# Patient Record
Sex: Male | Born: 1960 | Race: White | Hispanic: No | Marital: Married | Smoking: Never smoker
Health system: Southern US, Community
[De-identification: ages and names within clinical notes are randomized; demographics above are authoritative.]

## PROBLEM LIST (undated history)

## (undated) HISTORY — PX: TONSILLECTOMY AND ADENOIDECTOMY: SUR1326

## (undated) HISTORY — PX: CYST EXCISION: SHX5701

---

## 1997-06-10 ENCOUNTER — Emergency Department (HOSPITAL_COMMUNITY): Admission: EM | Admit: 1997-06-10 | Discharge: 1997-06-10 | Payer: Self-pay | Admitting: Emergency Medicine

## 2005-11-17 ENCOUNTER — Emergency Department: Payer: Self-pay | Admitting: Emergency Medicine

## 2005-11-20 ENCOUNTER — Emergency Department: Payer: Self-pay | Admitting: Emergency Medicine

## 2005-11-24 ENCOUNTER — Emergency Department: Payer: Self-pay | Admitting: Emergency Medicine

## 2005-11-30 ENCOUNTER — Emergency Department: Payer: Self-pay | Admitting: Emergency Medicine

## 2005-12-14 ENCOUNTER — Emergency Department: Payer: Self-pay | Admitting: Emergency Medicine

## 2006-01-03 ENCOUNTER — Ambulatory Visit: Payer: Self-pay | Admitting: Internal Medicine

## 2009-12-22 ENCOUNTER — Ambulatory Visit: Payer: Self-pay | Admitting: Gastroenterology

## 2009-12-25 LAB — PATHOLOGY REPORT

## 2010-10-03 ENCOUNTER — Ambulatory Visit: Payer: Self-pay | Admitting: Family Medicine

## 2013-03-19 IMAGING — CR DG CHEST 2V
1 series · 2 of 2 positions shown · non-contrast
Comparison: none

REASON FOR EXAM: bronchitis
COMMENTS:

PROCEDURE:     KDR - KDXR CHEST PA (OR AP) AND LAT  - October 03, 2010 [DATE]
RESULT:     The lung fields are clear. No pneumonia, pneumothorax or pleural
effusion is seen. The heart size is normal. The mediastinal and osseous
structures show no significant abnormalities.

[Series 1: view not recorded · 0.17mm/px · 2 of 2 slices shown]
[im 1/2]
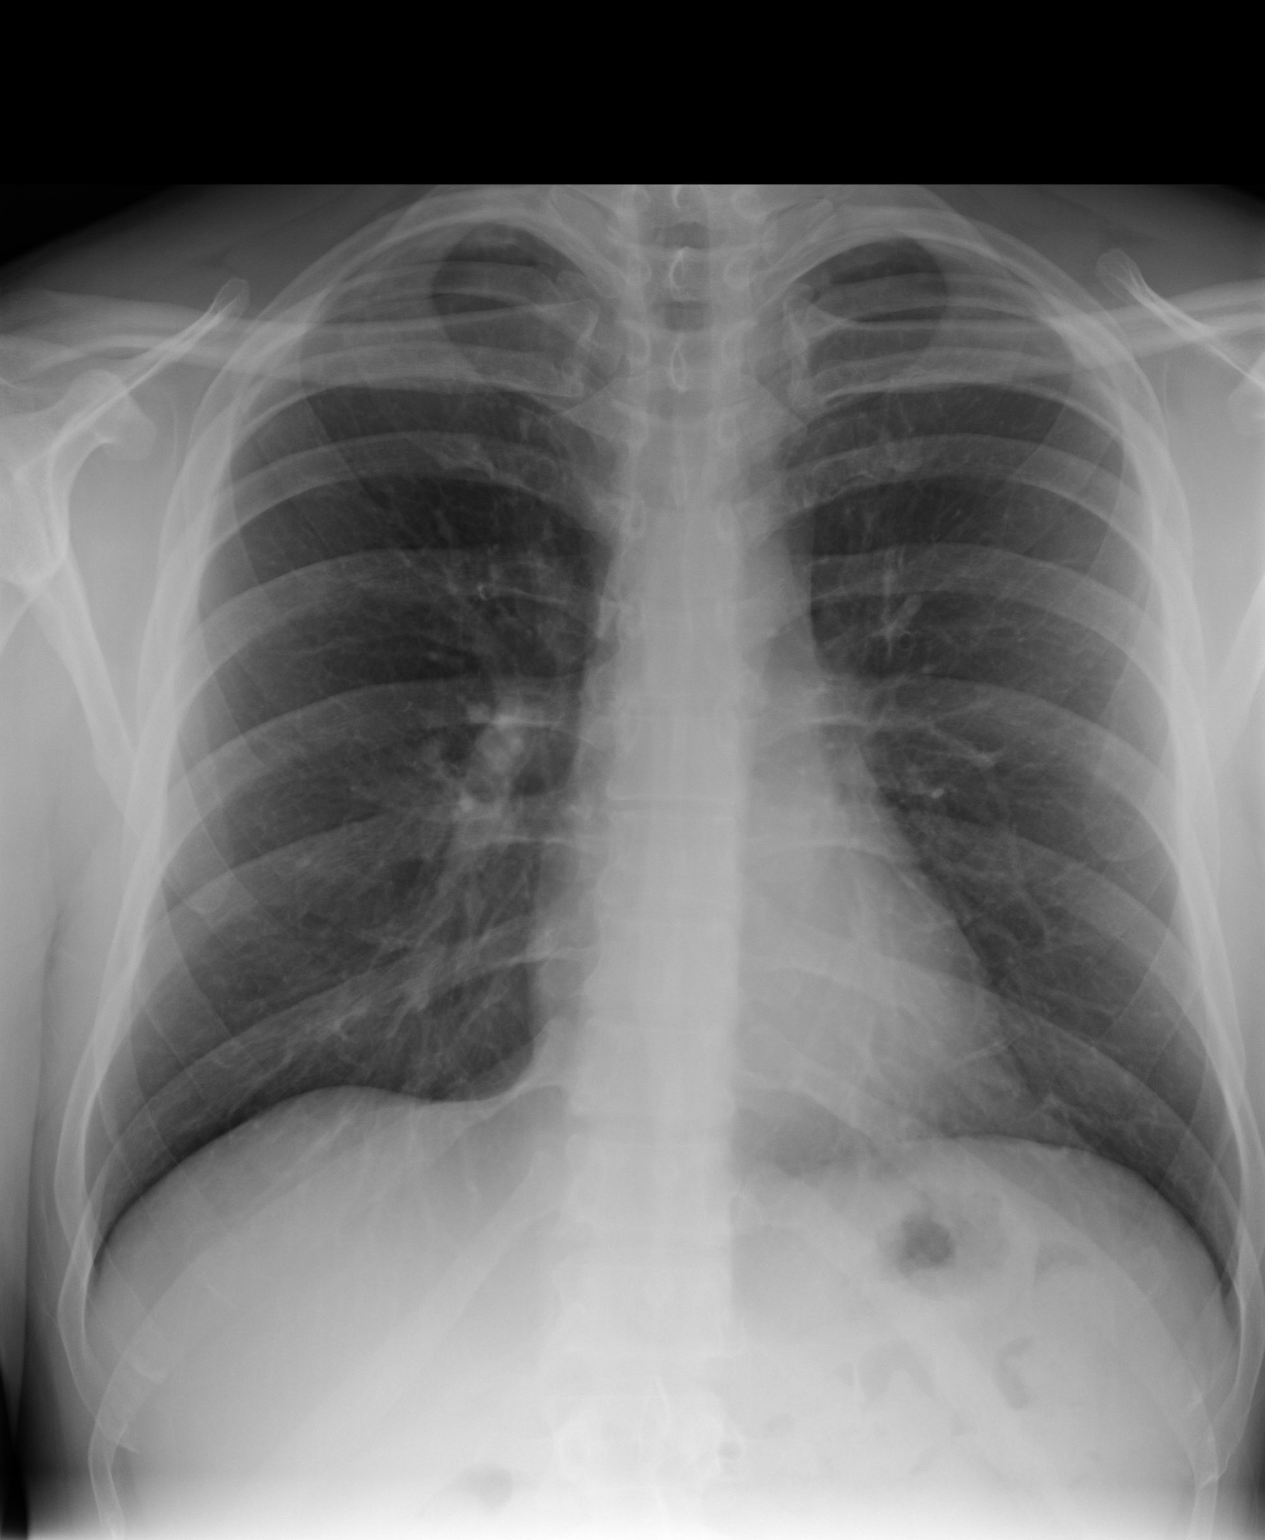
[im 2/2]
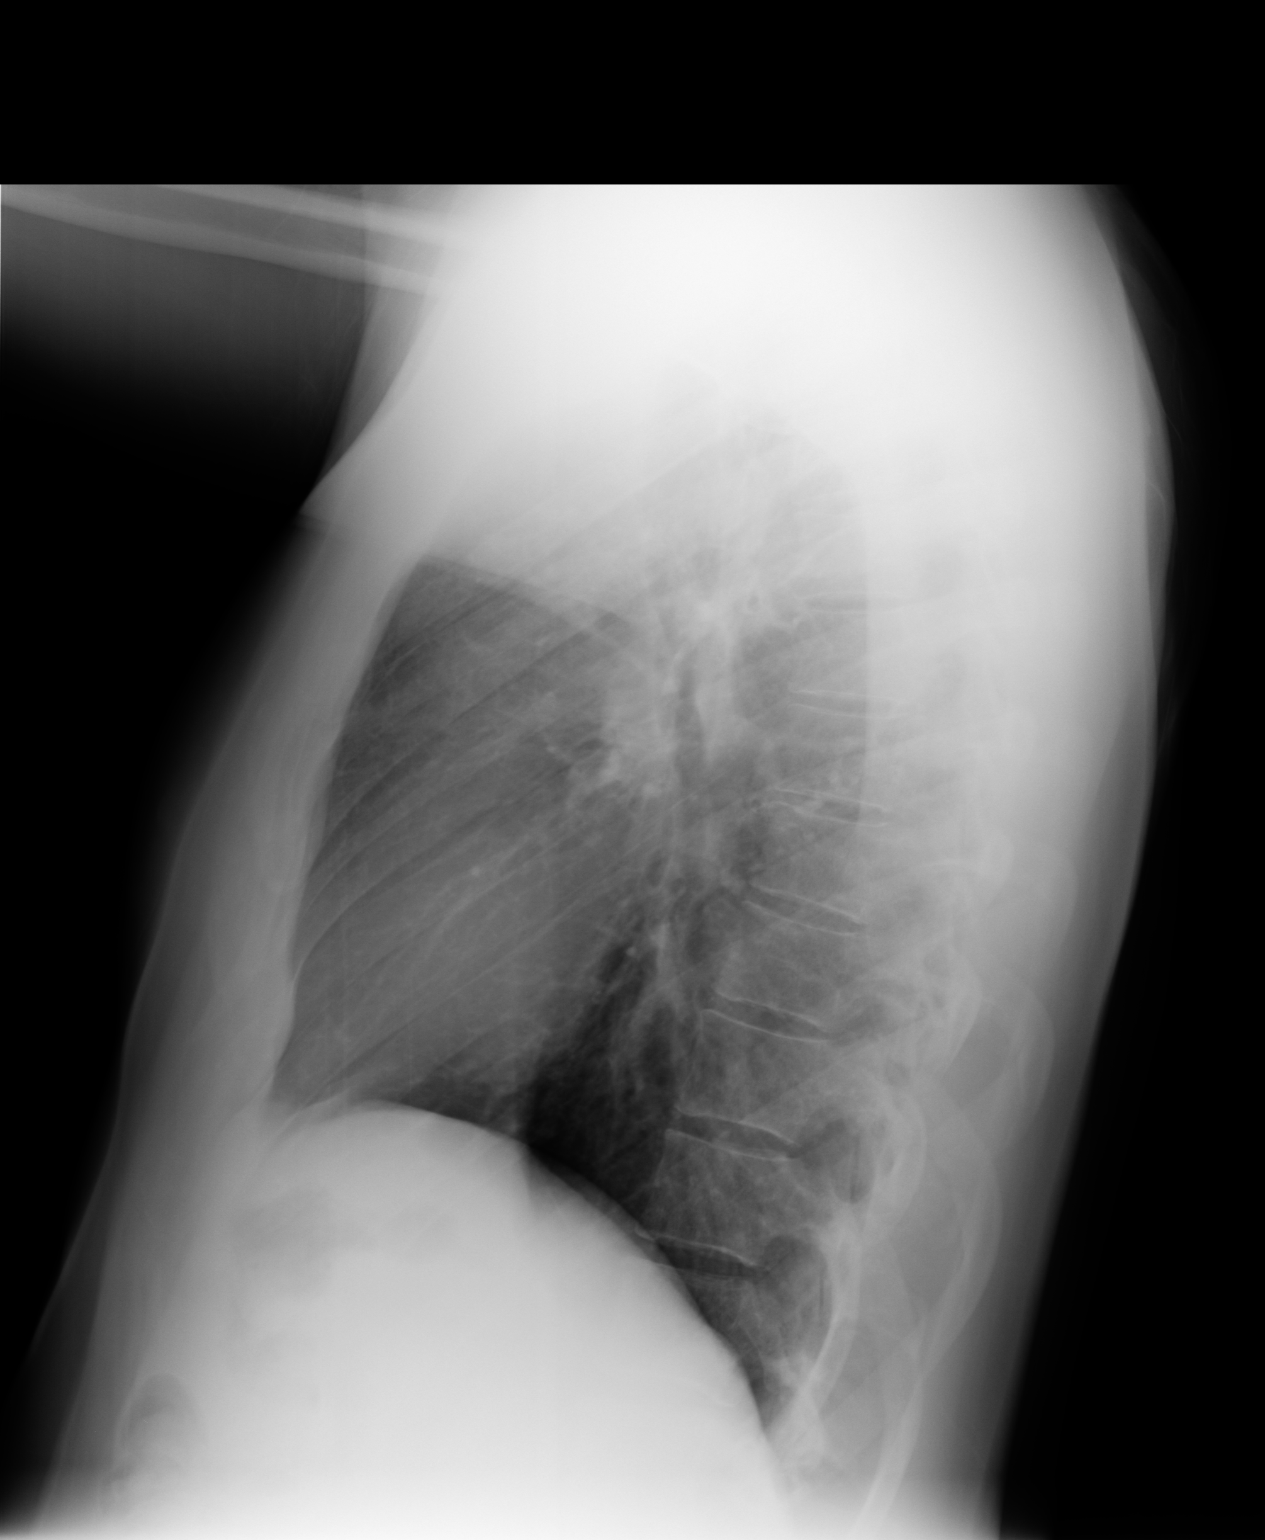

[2 of 2 positions shown; findings below may reference images not displayed]

IMPRESSION: No acute changes are identified.

## 2013-10-22 LAB — HEPATIC FUNCTION PANEL
ALT: 21 U/L (ref 10–40)
AST: 24 U/L (ref 14–40)
Alkaline Phosphatase: 59 U/L (ref 25–125)

## 2013-10-22 LAB — LIPID PANEL
Cholesterol: 176 mg/dL (ref 0–200)
HDL: 43 mg/dL (ref 35–70)
LDL CALC: 103 mg/dL
LDL/HDL RATIO: 2.4
TRIGLYCERIDES: 148 mg/dL (ref 40–160)

## 2013-10-22 LAB — CBC AND DIFFERENTIAL
HCT: 45 % (ref 41–53)
HEMOGLOBIN: 15.5 g/dL (ref 13.5–17.5)
NEUTROS ABS: 2 /uL
PLATELETS: 236 10*3/uL (ref 150–399)
WBC: 4.3 10^3/mL

## 2013-10-22 LAB — BASIC METABOLIC PANEL
BUN: 12 mg/dL (ref 4–21)
Creatinine: 1.1 mg/dL (ref 0.6–1.3)
Glucose: 95 mg/dL
Sodium: 143 mmol/L (ref 137–147)

## 2013-10-22 LAB — PSA: PSA: 0.6

## 2013-10-22 LAB — TSH: TSH: 1.75 u[IU]/mL (ref 0.41–5.90)

## 2013-11-19 LAB — BASIC METABOLIC PANEL: POTASSIUM: 5.1 mmol/L (ref 3.4–5.3)

## 2014-09-29 DIAGNOSIS — E291 Testicular hypofunction: Secondary | ICD-10-CM | POA: Insufficient documentation

## 2014-09-29 DIAGNOSIS — G4700 Insomnia, unspecified: Secondary | ICD-10-CM | POA: Insufficient documentation

## 2014-09-29 DIAGNOSIS — E739 Lactose intolerance, unspecified: Secondary | ICD-10-CM | POA: Insufficient documentation

## 2014-09-29 DIAGNOSIS — R001 Bradycardia, unspecified: Secondary | ICD-10-CM | POA: Insufficient documentation

## 2014-10-03 ENCOUNTER — Encounter: Payer: Self-pay | Admitting: Family Medicine

## 2014-10-03 ENCOUNTER — Ambulatory Visit (INDEPENDENT_AMBULATORY_CARE_PROVIDER_SITE_OTHER): Payer: 59 | Admitting: Family Medicine

## 2014-10-03 VITALS — BP 96/68 | HR 80 | Temp 98.5°F | Resp 16 | Ht 68.75 in | Wt 174.8 lb

## 2014-10-03 DIAGNOSIS — Z0000 Encounter for general adult medical examination without abnormal findings: Secondary | ICD-10-CM

## 2014-10-03 DIAGNOSIS — Z125 Encounter for screening for malignant neoplasm of prostate: Secondary | ICD-10-CM | POA: Diagnosis not present

## 2014-10-03 DIAGNOSIS — Z1211 Encounter for screening for malignant neoplasm of colon: Secondary | ICD-10-CM

## 2014-10-03 LAB — IFOBT (OCCULT BLOOD): IFOBT: NEGATIVE

## 2014-10-03 NOTE — Progress Notes (Signed)
Patient ID: Scott Webb, male   DOB: 11/13/1960, 54 y.o.   MRN: [id number]       Patient: Scott Webb, Male    DOB: [birth year], 54 y.o.   MRN: [id number] Visit Date: 10/03/2014  Today's Provider: Megan Mans, MD   Chief Complaint  Patient presents with  . Annual Exam    last annual physical was on 09/23/2013   Subjective:   Last ov was on 09/23/2013 EKG 07/26/2008  Colonoscopy 10/2009 repeat in 10 years     Annual physical exam Scott Webb is a 54 y.o. male who presents today for health maintenance and complete physical. He feels well. He reports exercising yes, yoga, stretches and walk 3 times a week.Marland Kitchen He reports he is sleeping well.  -----------------------------------------------------------------   Review of Systems  Constitutional: Negative.   HENT: Negative.   Eyes: Negative.   Respiratory: Negative.   Cardiovascular: Negative.   Gastrointestinal: Negative.   Endocrine: Negative.   Genitourinary: Negative.   Allergic/Immunologic: Positive for food allergies.       Dairy intolerance, gluten products, and chicken, and [place].  Neurological: Negative.   Hematological: Negative.   Psychiatric/Behavioral: Negative.     Social History He  reports that he has never smoked. He does not have any smokeless tobacco history on file. He reports that he does not drink alcohol or use illicit drugs. Social History   Social History  . Marital Status: Married    Spouse Name: N/A  . Number of Children: N/A  . Years of Education: N/A   Social History Main Topics  . Smoking status: Never Smoker   . Smokeless tobacco: None  . Alcohol Use: No  . Drug Use: No  . Sexual Activity: Not Asked   Other Topics Concern  . None   Social History Narrative    Patient Active Problem List   Diagnosis Date Noted  . Bradycardia 09/29/2014  . 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 Cannot sleep 09/29/2014  . Lactose intolerance 09/29/2014  . Family history of cancer of  digestive system 07/26/2008  . Hypercholesterolemia without hypertriglyceridemia 09/04/2006    Past Surgical History  Procedure Laterality Date  . Tonsillectomy and adenoidectomy    . Cyst excision      pilonidal cyst resection at age 55    Family History  Family Status  Relation Status Death Age  . Mother Alive   . Father Alive   . Sister Alive   . Brother Alive   . Daughter Alive   . Sister Alive    His family history includes Graves' disease in his sister and sister; Heart disease in his father; Hypertension in his father; Pancreatic cancer in his mother.    Allergies  Allergen Reactions  . Gluten Meal Other (See Comments)    flatulence  . Chicken Allergy Rash    Previous Medications   MISC. DEVICES (NASAL SPRAY BOTTLE) MISC    NASAL SPRAY BOTTLE (Miscellaneous) - Historical Medication  as directed Active   MULTIPLE VITAMIN TABLET    Take by mouth.    Patient Care Team: Maple Hudson., MD as PCP - General (Family Medicine)     Objective:   Vitals: BP 96/68 mmHg  Pulse 80  Temp(Src) 98.5 F (36.9 C) (Oral)  Resp 16  Ht 5' 8.75" (1.746 m)  Wt 174 lb 12.8 oz (79.289 kg)  BMI 26.01 kg/m2   Physical Exam  Constitutional: He is oriented to person, place, and time. He appears well-developed and well-nourished.  HENT:  Head: Normocephalic and atraumatic.  Right Ear: External ear normal.  Left Ear: External ear normal.  Nose: Nose normal.  Eyes: Conjunctivae are normal.  Neck: Normal range of motion. Neck supple.  Cardiovascular: Normal rate, regular rhythm and normal heart sounds.   Pulmonary/Chest: Effort normal and breath sounds normal.  Abdominal: Soft. Bowel sounds are normal.  Genitourinary: Rectum normal, prostate normal and penis normal.  Musculoskeletal: Normal range of motion.  Neurological: He is alert and oriented to person, place, and time.  Skin: Skin is warm and dry.  Psychiatric: He has a normal mood and affect. His behavior is  normal. Judgment and thought content normal.     Depression Screen No flowsheet data found.    Assessment & Plan:     Routine Health Maintenance and Physical Exam  Exercise Activities and Dietary recommendations Goals    None      Immunization History  Administered Date(s) Administered  . DTaP 01/22/2004  . Tdap 09/04/2011    Health Maintenance  Topic Date Due  . Hepatitis C Screening  Aug 08, 1960  . HIV Screening  07/09/1973  . COLONOSCOPY  07/09/2008  . INFLUENZA VACCINE  08/22/2014  . TETANUS/TDAP  09/03/2021      Discussed health benefits of physical activity, and encouraged him to engage in regular exercise appropriate for his age and condition.   RTC 1 year. --------------------------------------------------------------------

## 2014-10-07 LAB — LIPID PANEL
CHOLESTEROL: 189 mg/dL (ref 0–200)
HDL: 196 mg/dL — AB (ref 35–70)
LDL Cholesterol: 109 mg/dL
LDl/HDL Ratio: 4.6
TRIGLYCERIDES: 41 mg/dL (ref 40–160)

## 2014-10-07 LAB — HEPATIC FUNCTION PANEL
ALT: 20 U/L (ref 10–40)
AST: 20 U/L (ref 14–40)
Alkaline Phosphatase: 55 U/L (ref 25–125)
Bilirubin, Total: 0.8 mg/dL

## 2014-10-07 LAB — BASIC METABOLIC PANEL
BUN: 12 mg/dL (ref 4–21)
Creatinine: 1 mg/dL (ref 0.6–1.3)
GLUCOSE: 92 mg/dL
POTASSIUM: 5.2 mmol/L (ref 3.4–5.3)
Sodium: 143 mmol/L (ref 137–147)

## 2014-10-07 LAB — CBC AND DIFFERENTIAL
HEMATOCRIT: 45 % (ref 41–53)
HEMOGLOBIN: 15 g/dL (ref 13.5–17.5)
Platelets: 206 10*3/uL (ref 150–399)
WBC: 3.8 10^3/mL

## 2014-10-07 LAB — PSA: PSA: 0.4

## 2014-10-07 LAB — TSH: TSH: 2.05 u[IU]/mL (ref 0.41–5.90)

## 2014-10-17 ENCOUNTER — Encounter: Payer: Self-pay | Admitting: Family Medicine

## 2015-10-04 ENCOUNTER — Encounter: Payer: 59 | Admitting: Family Medicine

## 2015-11-22 ENCOUNTER — Encounter: Payer: Self-pay | Admitting: Family Medicine

## 2015-11-22 ENCOUNTER — Ambulatory Visit (INDEPENDENT_AMBULATORY_CARE_PROVIDER_SITE_OTHER): Payer: BLUE CROSS/BLUE SHIELD | Admitting: Family Medicine

## 2015-11-22 VITALS — BP 102/62 | HR 82 | Temp 97.8°F | Resp 14 | Ht 69.0 in | Wt 182.0 lb

## 2015-11-22 DIAGNOSIS — Z1211 Encounter for screening for malignant neoplasm of colon: Secondary | ICD-10-CM | POA: Diagnosis not present

## 2015-11-22 DIAGNOSIS — Z0000 Encounter for general adult medical examination without abnormal findings: Secondary | ICD-10-CM | POA: Diagnosis not present

## 2015-11-22 LAB — POCT URINALYSIS DIPSTICK
BILIRUBIN UA: NEGATIVE
Blood, UA: NEGATIVE
GLUCOSE UA: NEGATIVE
Ketones, UA: NEGATIVE
LEUKOCYTES UA: NEGATIVE
NITRITE UA: NEGATIVE
PH UA: 6
Protein, UA: NEGATIVE
Spec Grav, UA: 1.01
Urobilinogen, UA: 0.2

## 2015-11-22 LAB — IFOBT (OCCULT BLOOD): IMMUNOLOGICAL FECAL OCCULT BLOOD TEST: NEGATIVE

## 2015-11-22 NOTE — Progress Notes (Signed)
Patient: Scott Webb, Male    DOB: 04/13/1960, 55 y.o.   MRN: [id number] Visit Date: 11/22/2015  Today's Provider: Megan Mansichard Gilbert Jr, MD   Chief Complaint  Patient presents with  . Annual Exam   Subjective:    Annual physical exam Scott CongressMirek Steven is a 55 y.o. male who presents today for health maintenance and complete physical. He feels well. He reports exercising about 3-4 times a week. He reports he is sleeping well. He is married and his father one 57 year old daughter. His wife is home schooling their daughter.  ----------------------------------------------------------------- Colonoscopy- 12/22/09 Colonic mucosa with prominent melanosis coli. Negative for dysplasia.   Review of Systems  Constitutional: Negative.   HENT: Negative.   Eyes: Negative.   Respiratory: Negative.   Cardiovascular: Negative.   Gastrointestinal: Negative.   Endocrine: Negative.   Genitourinary: Negative.   Musculoskeletal: Negative.   Skin: Negative.   Allergic/Immunologic: Negative.   Neurological: Negative.   Hematological: Negative.   Psychiatric/Behavioral: Negative.     Social History      He  reports that he has never smoked. He has never used smokeless tobacco. He reports that he does not drink alcohol or use drugs.       Social History   Social History  . Marital status: Married    Spouse name: N/A  . Number of children: N/A  . Years of education: N/A   Social History Main Topics  . Smoking status: Never Smoker  . Smokeless tobacco: Never Used  . Alcohol use No  . Drug use: No  . Sexual activity: Not Asked   Other Topics Concern  . None   Social History Narrative  . None    History reviewed. No pertinent past medical history.   Patient Active Problem List   Diagnosis Date Noted  . Bradycardia 09/29/2014  . 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 Cannot sleep 09/29/2014  . Lactose intolerance 09/29/2014  . Family history of cancer of digestive system 07/26/2008    . Hypercholesterolemia without hypertriglyceridemia 09/04/2006    Past Surgical History:  Procedure Laterality Date  . CYST EXCISION     pilonidal cyst resection at age 55  . TONSILLECTOMY AND ADENOIDECTOMY      Family History        Family Status  Relation Status  . Mother Alive  . Father Alive  . Sister Alive  . Brother Alive  . Daughter Alive  . Sister Alive        His family history includes Graves' disease in his sister and sister; Heart disease in his father; Hypertension in his father; Pancreatic cancer in his mother.    Allergies  Allergen Reactions  . Gluten Meal Other (See Comments)    flatulence  . Chicken Allergy Rash    No outpatient prescriptions have been marked as taking for the 11/22/15 encounter (Office Visit) with Maple Hudsonichard L Gilbert Jr., MD.    Patient Care Team: Maple Hudsonichard L Gilbert Jr., MD as PCP - General (Family Medicine)     Objective:   Vitals: There were no vitals taken for this visit.   Physical Exam  Constitutional: He is oriented to person, place, and time. He appears well-developed and well-nourished.  HENT:  Head: Normocephalic and atraumatic.  Right Ear: External ear normal.  Left Ear: External ear normal.  Nose: Nose normal.  Mouth/Throat: Oropharynx is clear and moist.  Eyes: Conjunctivae and EOM are normal. Pupils are equal, round, and reactive to light.  Neck:  Normal range of motion. Neck supple.  Cardiovascular: Normal rate, regular rhythm, normal heart sounds and intact distal pulses.   Pulmonary/Chest: Effort normal and breath sounds normal.  Abdominal: Soft. Bowel sounds are normal.  Genitourinary: Rectum normal, prostate normal and penis normal.  Musculoskeletal: Normal range of motion.  Neurological: He is alert and oriented to person, place, and time. He has normal reflexes.  Skin: Skin is warm and dry.  Psychiatric: He has a normal mood and affect. His behavior is normal. Judgment and thought content normal.      Depression Screen No flowsheet data found.    Assessment & Plan:     Routine Health Maintenance and Physical Exam  Exercise Activities and Dietary recommendations Goals    None      Immunization History  Administered Date(s) Administered  . DTaP 01/22/2004  . Tdap 09/04/2011    Health Maintenance  Topic Date Due  . Hepatitis C Screening  May 25, 1960  . HIV Screening  07/09/1973  . COLONOSCOPY  07/09/2008  . INFLUENZA VACCINE  08/22/2015  . TETANUS/TDAP  09/03/2021      Discussed health benefits of physical activity, and encouraged him to engage in regular exercise appropriate for his age and condition.    --------------------------------------------------------------------   I have done the exam and reviewed the above chart and it is accurate to the best of my knowledge.  Richard Wendelyn BreslowGilbert Jr, MD  [facility]

## 2015-11-29 LAB — BASIC METABOLIC PANEL
BUN: 12 mg/dL (ref 4–21)
Creatinine: 1 mg/dL (ref 0.6–1.3)
Glucose: 85 mg/dL
Potassium: 4.1 mmol/L (ref 3.4–5.3)
Sodium: 140 mmol/L (ref 137–147)

## 2015-11-29 LAB — HEPATIC FUNCTION PANEL
ALT: 19 U/L (ref 10–40)
AST: 18 U/L (ref 14–40)
Alkaline Phosphatase: 65 U/L (ref 25–125)
Bilirubin, Total: 0.5 mg/dL

## 2016-11-26 ENCOUNTER — Encounter: Payer: BLUE CROSS/BLUE SHIELD | Admitting: Family Medicine
